# Patient Record
Sex: Female | Born: 1950 | Race: White | Hispanic: No | Smoking: Current every day smoker
Health system: Southern US, Community
[De-identification: ages and names within clinical notes are randomized; demographics above are authoritative.]

## PROBLEM LIST (undated history)

## (undated) DIAGNOSIS — F319 Bipolar disorder, unspecified: Secondary | ICD-10-CM

## (undated) DIAGNOSIS — F909 Attention-deficit hyperactivity disorder, unspecified type: Secondary | ICD-10-CM

## (undated) DIAGNOSIS — R600 Localized edema: Secondary | ICD-10-CM

## (undated) DIAGNOSIS — R32 Unspecified urinary incontinence: Secondary | ICD-10-CM

## (undated) DIAGNOSIS — I10 Essential (primary) hypertension: Secondary | ICD-10-CM

## (undated) DIAGNOSIS — E079 Disorder of thyroid, unspecified: Secondary | ICD-10-CM

## (undated) HISTORY — PX: APPENDECTOMY: SHX54

## (undated) HISTORY — DX: Attention-deficit hyperactivity disorder, unspecified type: F90.9

## (undated) HISTORY — DX: Unspecified urinary incontinence: R32

## (undated) HISTORY — DX: Essential (primary) hypertension: I10

## (undated) HISTORY — DX: Bipolar disorder, unspecified: F31.9

## (undated) HISTORY — PX: OTHER SURGICAL HISTORY: SHX169

---

## 1997-05-27 HISTORY — PX: ABDOMINAL HYSTERECTOMY: SHX81

## 1998-06-24 ENCOUNTER — Other Ambulatory Visit: Admission: RE | Admit: 1998-06-24 | Discharge: 1998-06-24 | Payer: Self-pay | Admitting: Surgery

## 1998-07-07 ENCOUNTER — Ambulatory Visit (HOSPITAL_BASED_OUTPATIENT_CLINIC_OR_DEPARTMENT_OTHER): Admission: RE | Admit: 1998-07-07 | Discharge: 1998-07-07 | Payer: Self-pay | Admitting: Surgery

## 1998-07-27 ENCOUNTER — Ambulatory Visit (HOSPITAL_COMMUNITY): Admission: RE | Admit: 1998-07-27 | Discharge: 1998-07-27 | Payer: Self-pay | Admitting: Surgery

## 1998-07-28 ENCOUNTER — Ambulatory Visit (HOSPITAL_COMMUNITY): Admission: RE | Admit: 1998-07-28 | Discharge: 1998-07-28 | Payer: Self-pay | Admitting: Surgery

## 1998-07-28 ENCOUNTER — Encounter: Payer: Self-pay | Admitting: Surgery

## 1998-12-21 ENCOUNTER — Other Ambulatory Visit: Admission: RE | Admit: 1998-12-21 | Discharge: 1998-12-21 | Payer: Self-pay | Admitting: *Deleted

## 1999-05-17 ENCOUNTER — Encounter: Admission: RE | Admit: 1999-05-17 | Discharge: 1999-05-17 | Payer: Self-pay | Admitting: Gynecology

## 1999-05-17 ENCOUNTER — Encounter: Payer: Self-pay | Admitting: Gynecology

## 1999-06-08 ENCOUNTER — Encounter: Payer: Self-pay | Admitting: Gynecology

## 1999-06-08 ENCOUNTER — Encounter: Admission: RE | Admit: 1999-06-08 | Discharge: 1999-06-08 | Payer: Self-pay | Admitting: Gynecology

## 2000-05-27 HISTORY — PX: THYROID SURGERY: SHX805

## 2000-08-04 ENCOUNTER — Encounter: Payer: Self-pay | Admitting: Surgery

## 2000-08-06 ENCOUNTER — Encounter (INDEPENDENT_AMBULATORY_CARE_PROVIDER_SITE_OTHER): Payer: Self-pay | Admitting: *Deleted

## 2000-08-06 ENCOUNTER — Ambulatory Visit (HOSPITAL_COMMUNITY): Admission: RE | Admit: 2000-08-06 | Discharge: 2000-08-07 | Payer: Self-pay | Admitting: Surgery

## 2000-08-18 ENCOUNTER — Encounter: Payer: Self-pay | Admitting: Gynecology

## 2000-08-18 ENCOUNTER — Encounter: Admission: RE | Admit: 2000-08-18 | Discharge: 2000-08-18 | Payer: Self-pay | Admitting: Gynecology

## 2001-08-24 ENCOUNTER — Other Ambulatory Visit: Admission: RE | Admit: 2001-08-24 | Discharge: 2001-08-24 | Payer: Self-pay | Admitting: Gynecology

## 2001-08-28 ENCOUNTER — Encounter: Payer: Self-pay | Admitting: Gynecology

## 2001-08-28 ENCOUNTER — Encounter: Admission: RE | Admit: 2001-08-28 | Discharge: 2001-08-28 | Payer: Self-pay | Admitting: Gynecology

## 2001-09-04 ENCOUNTER — Encounter: Admission: RE | Admit: 2001-09-04 | Discharge: 2001-09-04 | Payer: Self-pay | Admitting: Gynecology

## 2001-09-04 ENCOUNTER — Encounter: Payer: Self-pay | Admitting: Gynecology

## 2002-02-22 ENCOUNTER — Ambulatory Visit (HOSPITAL_COMMUNITY)
Admission: RE | Admit: 2002-02-22 | Discharge: 2002-02-22 | Payer: Self-pay | Admitting: Physical Medicine and Rehabilitation

## 2002-02-22 ENCOUNTER — Encounter: Payer: Self-pay | Admitting: Physical Medicine and Rehabilitation

## 2002-09-29 ENCOUNTER — Encounter: Admission: RE | Admit: 2002-09-29 | Discharge: 2002-09-29 | Payer: Self-pay

## 2002-11-06 ENCOUNTER — Inpatient Hospital Stay (HOSPITAL_COMMUNITY): Admission: EM | Admit: 2002-11-06 | Discharge: 2002-11-07 | Payer: Self-pay | Admitting: Emergency Medicine

## 2002-11-06 ENCOUNTER — Encounter: Payer: Self-pay | Admitting: Emergency Medicine

## 2002-11-16 ENCOUNTER — Encounter: Admission: RE | Admit: 2002-11-16 | Discharge: 2002-11-16 | Payer: Self-pay | Admitting: Neurosurgery

## 2002-11-16 ENCOUNTER — Encounter: Payer: Self-pay | Admitting: Neurosurgery

## 2002-11-18 ENCOUNTER — Other Ambulatory Visit: Admission: RE | Admit: 2002-11-18 | Discharge: 2002-11-18 | Payer: Self-pay | Admitting: Gynecology

## 2003-05-28 ENCOUNTER — Emergency Department (HOSPITAL_COMMUNITY): Admission: EM | Admit: 2003-05-28 | Discharge: 2003-05-28 | Payer: Self-pay | Admitting: Emergency Medicine

## 2005-05-27 HISTORY — PX: NECK SURGERY: SHX720

## 2005-09-26 ENCOUNTER — Ambulatory Visit (HOSPITAL_COMMUNITY): Admission: RE | Admit: 2005-09-26 | Discharge: 2005-09-27 | Payer: Self-pay | Admitting: Neurological Surgery

## 2008-03-04 ENCOUNTER — Ambulatory Visit (HOSPITAL_COMMUNITY): Admission: RE | Admit: 2008-03-04 | Discharge: 2008-03-04 | Payer: Self-pay | Admitting: Family Medicine

## 2009-08-28 ENCOUNTER — Emergency Department (HOSPITAL_BASED_OUTPATIENT_CLINIC_OR_DEPARTMENT_OTHER): Admission: EM | Admit: 2009-08-28 | Discharge: 2009-08-29 | Payer: Self-pay | Admitting: Emergency Medicine

## 2009-12-19 ENCOUNTER — Emergency Department (HOSPITAL_COMMUNITY): Admission: EM | Admit: 2009-12-19 | Discharge: 2009-12-19 | Payer: Self-pay | Admitting: Emergency Medicine

## 2009-12-27 ENCOUNTER — Ambulatory Visit (HOSPITAL_COMMUNITY): Admission: RE | Admit: 2009-12-27 | Discharge: 2009-12-27 | Payer: Self-pay | Admitting: Family Medicine

## 2010-05-01 ENCOUNTER — Emergency Department (HOSPITAL_COMMUNITY)
Admission: EM | Admit: 2010-05-01 | Discharge: 2010-05-01 | Payer: Self-pay | Source: Home / Self Care | Admitting: Emergency Medicine

## 2010-08-11 LAB — DIFFERENTIAL
Basophils Absolute: 0.1 10*3/uL (ref 0.0–0.1)
Basophils Relative: 1 % (ref 0–1)
Eosinophils Absolute: 0.1 10*3/uL (ref 0.0–0.7)
Eosinophils Relative: 1 % (ref 0–5)
Lymphs Abs: 3.8 10*3/uL (ref 0.7–4.0)
Monocytes Absolute: 0.7 10*3/uL (ref 0.1–1.0)
Neutro Abs: 7.2 10*3/uL (ref 1.7–7.7)

## 2010-08-11 LAB — BASIC METABOLIC PANEL
CO2: 26 mEq/L (ref 19–32)
Chloride: 102 mEq/L (ref 96–112)
Creatinine, Ser: 0.73 mg/dL (ref 0.4–1.2)
Glucose, Bld: 91 mg/dL (ref 70–99)
Potassium: 2.9 mEq/L — ABNORMAL LOW (ref 3.5–5.1)

## 2010-08-11 LAB — CBC
HCT: 39.6 % (ref 36.0–46.0)
Hemoglobin: 13.6 g/dL (ref 12.0–15.0)
MCHC: 34.4 g/dL (ref 30.0–36.0)
MCV: 91 fL (ref 78.0–100.0)

## 2010-08-15 LAB — COMPREHENSIVE METABOLIC PANEL
Alkaline Phosphatase: 65 U/L (ref 39–117)
GFR calc non Af Amer: >60 mL/min (ref >60)
Glucose, Bld: 115 mg/dL — ABNORMAL HIGH (ref 70–99)
Potassium: 3.3 mEq/L — ABNORMAL LOW (ref 3.5–5.1)
Total Bilirubin: 0.6 mg/dL (ref 0.3–1.2)
Total Protein: 7.4 g/dL (ref 6.0–8.3)

## 2010-08-15 LAB — URINALYSIS, ROUTINE W REFLEX MICROSCOPIC
Bilirubin Urine: NEGATIVE
Glucose, UA: NEGATIVE mg/dL

## 2010-08-15 LAB — URINE CULTURE

## 2010-08-15 LAB — URINE MICROSCOPIC-ADD ON

## 2010-08-15 LAB — CBC
Platelets: 386 10*3/uL (ref 150–400)
WBC: 17.5 10*3/uL — ABNORMAL HIGH (ref 4.0–10.5)

## 2010-08-15 LAB — DIFFERENTIAL
Basophils Absolute: 0.2 10*3/uL — ABNORMAL HIGH (ref 0.0–0.1)
Basophils Relative: 1 % (ref 0–1)
Lymphocytes Relative: 13 % (ref 12–46)
Lymphs Abs: 2.3 10*3/uL (ref 0.7–4.0)
Monocytes Relative: 4 % (ref 3–12)
Neutro Abs: 14.3 10*3/uL — ABNORMAL HIGH (ref 1.7–7.7)

## 2010-08-15 LAB — POCT TOXICOLOGY PANEL

## 2010-10-12 NOTE — Op Note (Signed)
[place]  Patient:    Teresa Gomez, Teresa Gomez                   MRN: [id number] Proc. Date: 08/06/00 Adm. Date:  [id number] Attending:  Bonnetta Barry CC:         Fritzi Mandes, M.D.             Gretta Cool, M.D.                           Operative Report  PREOPERATIVE DIAGNOSIS:  Thyroid goiter with dominant left side nodule.  POSTOPERATIVE DIAGNOSIS:  Thyroid goiter with dominant left side nodule.  OPERATION PERFORMED:  Total thyroidectomy.  SURGEON:  Velora Heckler, M.D.  ASSISTANT:  Gita Kudo, M.D.  ANESTHESIA:  General.  ESTIMATED BLOOD LOSS:  Minimal.  PREPARATION:  Betadine.  COMPLICATIONS:  None.  INDICATIONS FOR PROCEDURE:  The patient is a 60 year old white female who presents with multinodular goiter and recurrent thyroid cyst.  She has been followed for over two years.  She has had two previous aspirations of the left-sided cyst.  She now comes to surgery for thyroidectomy for compressive symptoms and recurrent thyroid cyst.  DESCRIPTION OF PROCEDURE:  The procedure was done in OR #15 at the [facility].  The patient was brought to the operating room and placed in supine position on the operating room table.  Following administration of general anesthesia, the patient was positioned and then prepped and draped in the usual strict aseptic fashion.  After ascertaining that an adequate level of anesthesia had been obtained, a standard Kocher incision was made with a #10 blade.  Dissection was carried down through the subcutaneous tissues.  Hemostasis was obtained with the electrocautery. Platysma was divided.  Skin flaps were raised cephalad and caudad from the thyroid notch to the sternal notch.  A Mahorner self-retaining retractor was placed for exposure.  Strap muscles were incised in the midline and dissection was begun on the left side.  Strap muscles were reflected laterally.   Thyroid gland was rolled medially.  There was a multinodular left lobe with a dominant cyst in the posterior left lobe measuring approximately 3 to 4 cm in greatest dimension.  The superior pole vessels were divided between medium Ligaclips and between 3-0 silk ties tied in continuity.  Superior pole was completely immobilized.  Venous tributaries were divided between small Ligaclips.  The inferior pole was mobilized and venous tributaries divided between small and medium Ligaclips.  The recurrent laryngeal nerve was positively identified and preserved.  Branches of the inferior thyroid artery were divided between small Ligaclips.  The gland was rolled medially.  Small pyramidal lobe was taken down using the electrocautery for hemostasis.  Ligament of Allyson Sabal was divided and the gland was rolled up and onto the anterior surface of the trachea. Good hemostasis was noted.  A pack was placed in the left neck.  Next, we turned our attention to the right lobe.  It is markedly smaller, but also multinodular.  Again the lobe was rolled medially.  Middle thyroid vein was divided between small ligaclips.  The superior pole was mobilized and superior pole vessels divided between small and medium Ligaclips as well as ligated in continuity with 3-0 silk ties.  The gland was rolled anteriorly.  Parathyroid tissue was positively identified and preserved.  Venous tributaries were divided between small and medium  Ligaclips.  Branches of the inferior thyroid artery were divided between small Ligaclips.  The gland was rolled anteriorly and the Ligament of Allyson Sabal was divided and the entire thyroid specimen was excised in toto.  The left lobe was sectioned on the table and contained a large cystic structure containing colloid.  A suture was used to mark the superior pole of the gland on the left side.  The gland was submitted to pathology in [place].  Good hemostasis was obtained in the neck.  The neck was  irrigated with warm saline.  Surgicel was placed over the region of the recurrent laryngeal nerves bilaterally.  Strap muscles were reapproximated in the midline with interrupted 3-0 Vicryl sutures.  The platysma was reapproximated with interrupted 3-0 Vicryl sutures.  Skin edges were reapproximated with widely spaced stainless steel staples and interspaced benzoin and half inch Steri-Strips.  Sterile gauze dressings were placed.  The patient was awakened from anesthesia and brought to the recovery room in stable condition.  The patient tolerated the procedure well. DD:  08/06/00 TD:  08/06/00 Job: [id number]

## 2010-10-12 NOTE — Discharge Summary (Signed)
NAME:  Teresa Gomez, Teresa Gomez                   ACCOUNT NO.:  [id number]   MEDICAL RECORD NO.:  [id number]                   PATIENT TYPE:  INP   LOCATION:  5008                                 FACILITY:  [facility]   PHYSICIAN:  Gabrielle Dare. Janee Morn, M.D.             DATE OF BIRTH:  July 23, 1950   DATE OF ADMISSION:  11/06/2002  DATE OF DISCHARGE:  11/07/2002                                 DISCHARGE SUMMARY   CONSULTING PHYSICIAN:  Donalee Citrin, M.D., neurosurgery.   FINAL DIAGNOSES:  1. Fall.  2. Alcohol.  3. C4 body chip fracture with a herniated disk and epidural hematoma.   HISTORY:  This is a 60 year old white female who was drinking after breaking  up with her boyfriend.  She lost her balance and fell to the level ground  striking her face.  She was complaining of neck pain.  On admission, she was  seen by Dr. Janee Morn, who ordered CT of the neck which showed a C4 body chip  fracture.  She also had noted a herniated disease which was notably a  chronic problem not acute.  Because of these findings, Dr. Wynetta Emery was seen.  Dr. Wynetta Emery saw the patient.  She underwent MRI and Dr. Wynetta Emery reviewed it and  noted there was no evidence of any acute injury.  She did have a herniated  disk at C3-4 area.  When he saw the patient, noted that she could be  discharged the following day from his standpoint.  The following morning,  she was doing well.  She was up and about without difficulty.  She was in a  C-collar.  She was complaining of some right shoulder pain but on exam there  was no significant tenderness noted.  Her arm was satisfactory.  She does  have an orthopedic surgeon.  She sees Dr. Luiz Blare, who treats her for other  problems.  At this point, she is prepared for discharge.  She is given  Percocet 1-2 p.o. q.4-6h. p.r.n. for pain.  She is told to follow up with  Dr. Wynetta Emery in two weeks.  She was given Dr. Lonie Peak number and told to call and  make an appointment.  She does not need to follow up with  the trauma service  at this time.  She is given our number to call should she have any problems  or any questions.  Currently, she is discharged home in satisfactory and  stable condition.     Phineas Semen, P.A.                      Gabrielle Dare Janee Morn, M.D.    CL/MEDQ  D:  11/07/2002  T:  11/07/2002  Job:  [id number]   cc:   Donalee Citrin, M.D.  [address]  [place]  [place] [address]  Fax: [phone]   Jimmye Norman III, M.D.  [phone]  [place]  [place] [address]  Fax: [phone]

## 2010-10-12 NOTE — Op Note (Signed)
NAMELAURIE, [name]            ACCOUNT NO.:  [id number]   MEDICAL RECORD NO.:  [id number]          PATIENT TYPE:  AMB   LOCATION:  SDS                          FACILITY:  [facility]   PHYSICIAN:  Stefani Dama, M.D.  DATE OF BIRTH:  01-30-1951   DATE OF PROCEDURE:  09/26/2005  DATE OF DISCHARGE:                                 OPERATIVE REPORT   PREOPERATIVE DIAGNOSIS:  His herniated nucleus pulposus, C6-C7 left, with  left cervical radiculopathy.   POSTOPERATIVE DIAGNOSIS:  His herniated nucleus pulposus, C6-C7 left, with  left cervical radiculopathy.   PROCEDURE:  Anterior cervical decompression arthrodesis C6-C7, with  structural allograft and PEAK bone spacer, anterior fixation with cervical  plate K4-M0, alpha pack.   SURGEON:  Stefani Dama, M.D.   FIRST ASSISTANT:  Payton Doughty, M.D.   ANESTHESIA:  General endotracheal.   INDICATIONS:  Teresa Gomez is a 60 year old individual who has had  significant neck, shoulder, and left arm pain and weakness in the C7  distribution.  She was found to have a herniated nucleus pulposus at C6-7.  Having failed efforts at conservative management and experiencing weakness  in the left triceps region, she was advised regarding surgical decompression  and arthrodesis.   PROCEDURE:  The patient was brought to the operating room and placed on  table supine position. After smooth induction of general endotracheal  anesthesia, the neck was placed in 5 pounds of halter traction and was  prepped with DuraPrep and draped in a sterile fashion.  A transverse  incision was made in the left side of the neck, and the dissection was taken  down through the platysma.  The plane between the sternocleidomastoid and  the strap muscles was dissected bluntly until the prevertebral space was  reached. The first identifiable disk space was noted be that of C6-C7 on a  localizing radiograph. The longus coli muscle was then stripped off of  either  side, and the self-retaining Caspar retractor was placed under the  longus coli muscle, and then dissection was continued by opening the disc  space with a 15 blade using a combination of curettes and rongeurs to remove  a substantial amount of moderately degenerated disc material from within the  dick space at C6-C7.  As the region of the posterior longitudinal ligament  was reached, there was noted be a small osteophytic overgrowth from the  inferior margin of the body of C6. This was dissected bluntly with a small  nerve hook, and as the posterior longitudinal ligament was broken through  there was noted to be some fragments of disc material on the left side in  the lateral recess.  This was taken up first with a 1 and a 2 mm Kerrison  punch, and the disc was exposed more expose more fully, and with dissection  and suction the disc material was removed from the free space.  The ligament  was taken up both medially and laterally across the disk space itself to  allow for exposure of the dura. In the lateral recess, some the hemorrhage  had occurred from epidural veins,  and this was controlled with bipolar  cautery and some small pledgets of Gelfoam soaked in thrombin, which were  later irrigated away. Self-retaining disc spreader was then removed, and the  disc space was shaped to make sure that was level and continuous. Trial  spacer was then used, and it was felt that a 7 mm medium-size PEAK bone  spacer would fit nicely into this defect.  This was then prepared and filled  with demineralized bone matrix and, once filled, it was placed into the  interspace and tamped in place until flush, and the ventral aspect of the  vertebral bodies were fitted with an 18 mm standard-size Alphatec plate with  variable-angle 14 mm screws in C6, fixed-angle 14 mm screws in C7.  With  this, hemostasis was achieved in the anterior longitudinal space, and the  retractor was  removed.  Final localizing  radiograph identified good position of the  surgical construct. The platysma was then closed with 3-0 Vicryl in an  interrupted fashion.  3-0 Vicryl used subcuticularly to close the skin.  The  patient tolerated the procedure well was returned to the recovery room in  stable condition.      Stefani Dama, M.D.  Electronically Signed     HJE/MEDQ  D:  09/26/2005  T:  09/27/2005  Job:  [id number]

## 2011-04-16 ENCOUNTER — Other Ambulatory Visit: Payer: Self-pay | Admitting: Family Medicine

## 2011-04-22 ENCOUNTER — Ambulatory Visit
Admission: RE | Admit: 2011-04-22 | Discharge: 2011-04-22 | Disposition: A | Discharge status: Home / Self Care | Payer: Managed Care, Other (non HMO) | Source: Ambulatory Visit | Attending: Family Medicine | Admitting: Family Medicine

## 2012-04-13 ENCOUNTER — Other Ambulatory Visit: Payer: Self-pay | Admitting: Family Medicine

## 2012-04-13 DIAGNOSIS — Z1231 Encounter for screening mammogram for malignant neoplasm of breast: Secondary | ICD-10-CM

## 2012-05-08 ENCOUNTER — Ambulatory Visit: Payer: Managed Care, Other (non HMO)

## 2013-02-08 ENCOUNTER — Other Ambulatory Visit: Payer: Self-pay | Admitting: Family Medicine

## 2013-02-08 ENCOUNTER — Emergency Department (INDEPENDENT_AMBULATORY_CARE_PROVIDER_SITE_OTHER): Payer: Managed Care, Other (non HMO)

## 2013-02-08 ENCOUNTER — Encounter: Payer: Self-pay | Admitting: *Deleted

## 2013-02-08 ENCOUNTER — Ambulatory Visit: Payer: Managed Care, Other (non HMO)

## 2013-02-08 ENCOUNTER — Emergency Department
Admission: EM | Admit: 2013-02-08 | Discharge: 2013-02-08 | Disposition: A | Discharge status: Home / Self Care | Payer: Managed Care, Other (non HMO) | Source: Home / Self Care | Attending: Family Medicine | Admitting: Family Medicine

## 2013-02-08 ENCOUNTER — Telehealth: Payer: Self-pay | Admitting: *Deleted

## 2013-02-08 DIAGNOSIS — W19XXXA Unspecified fall, initial encounter: Secondary | ICD-10-CM

## 2013-02-08 DIAGNOSIS — M25562 Pain in left knee: Secondary | ICD-10-CM

## 2013-02-08 DIAGNOSIS — M25569 Pain in unspecified knee: Secondary | ICD-10-CM

## 2013-02-08 DIAGNOSIS — Z716 Tobacco abuse counseling: Secondary | ICD-10-CM

## 2013-02-08 HISTORY — DX: Disorder of thyroid, unspecified: E07.9

## 2013-02-08 HISTORY — DX: Localized edema: R60.0

## 2013-02-08 MED ORDER — MELOXICAM 15 MG PO TABS: 15.0000 mg | ORAL_TABLET | Freq: Every day | ORAL | Status: DC

## 2013-02-08 MED ORDER — PREDNISONE 50 MG PO TABS: ORAL_TABLET | ORAL | Status: DC

## 2013-02-08 NOTE — ED Provider Notes (Addendum)
CSN: [id number]     Arrival date & time 02/08/13  1106 History   First MD Initiated Contact with Patient 02/08/13 1110     Chief Complaint  Patient presents with  . Knee Pain     HPI  L knee pain x 2-3 months  Pt states that she was initially struck in R knee by dog 3 months ago. Had initial R knee injury.  Has had progessive L knee pain over past month. Pt states that she accidentally fell on knee 4-6 weeks ago.  Has been able to bare weight.  No knee locking or giving away.  No knee swelling.  Has had worsening pain.  Pain is predominantly medio-posterior in the L knee.  No knee swelling.   Past Medical History  Diagnosis Date  . Thyroid disease   . Edema of both legs    Past Surgical History  Procedure Laterality Date  . Appendectomy    . Neck surgery     Family History  Problem Relation Age of Onset  . Diabetes Mother   . Diabetes Father    History  Substance Use Topics  . Smoking status: Current Every Day Smoker -- 1.00 packs/day  . Smokeless tobacco: Not on file  . Alcohol Use: No   OB History   Grav Para Term Preterm Abortions TAB SAB Ect Mult Living                 Review of Systems  All other systems reviewed and are negative.    Allergies  Review of patient's allergies indicates no known allergies.  Home Medications   Current Outpatient Rx  Name  Route  Sig  Dispense  Refill  . hydrochlorothiazide (HYDRODIURIL) 25 MG tablet   Oral   Take 25 mg by mouth daily.         Marland Kitchen levothyroxine (SYNTHROID, LEVOTHROID) 200 MCG tablet   Oral   Take 200 mcg by mouth daily before breakfast.          BP 128/79  Pulse 62  Temp(Src) 98.2 F (36.8 C) (Oral)  Resp 18  SpO2 97% Physical Exam  Constitutional: She appears well-developed and well-nourished.  HENT:  Head: Normocephalic and atraumatic.  Eyes: Conjunctivae are normal. Pupils are equal, round, and reactive to light.  Neck: Normal range of motion. Neck supple.  Cardiovascular: Normal  rate and regular rhythm.   Pulmonary/Chest: Effort normal and breath sounds normal.  Abdominal: Soft.  Musculoskeletal:       Legs: Neurological: She is alert.  Skin: Skin is warm.    ED Course  Procedures (including critical care time) Labs Review Labs Reviewed - No data to display Imaging Review Dg Knee Complete 4 Views Left  02/08/2013   *RADIOLOGY REPORT*  Clinical Data: Pain post fall.  LEFT KNEE - COMPLETE 4+ VIEW  Comparison: None.  Findings: No effusion. Negative for fracture, dislocation, or other acute abnormality.  Normal alignment and mineralization. No significant degenerative change.  Regional soft tissues unremarkable.  IMPRESSION:  Negative   Original Report Authenticated By: D. Andria Rhein, MD    MDM   1. Knee pain, left    Exam most consistent with medial meniscal injury versus MCL strain. Will place brace. NSAIDs and ice.Given the patient does have some posterior calf pain, will obtain lotion or sound rule out Baker's cyst versus DVT. Well score of around 0-1. Patient is a smoker. Discussed smoking cessation. Discussed general care musculoskeletal red flags. Followup with sports medicine  symptoms fail to improve.     The patient and/or caregiver has been counseled thoroughly with regard to treatment plan and/or medications prescribed including dosage, schedule, interactions, rationale for use, and possible side effects and they verbalize understanding. Diagnoses and expected course of recovery discussed and will return if not improved as expected or if the condition worsens. Patient and/or caregiver verbalized understanding.         Doree Albee, MD 02/08/13 1225  US Venous Img Lower Unilateral Left  02/08/2013   *RADIOLOGY REPORT*  Clinical Data: Left knee swelling and pain, calf pain  LEFT LOWER EXTREMITY VENOUS DOPPLER ULTRASOUND  Technique: Gray-scale sonography with compression, as well as color and duplex ultrasound, were performed to evaluate the  deep venous system from the level of the common femoral vein through the popliteal and proximal calf veins.  Comparison: None  Findings:  Normal compressibility of  the common femoral, superficial femoral, and popliteal veins, as well as the proximal calf veins.  No filling defects to suggest DVT on grayscale or color Doppler imaging.  Doppler waveforms show normal direction of venous flow, normal respiratory phasicity and response to augmentation. There is a 6 x 10 x 19 mm fluid collection in the posteromedial left popliteal fossa.  IMPRESSION: 1.  No evidence of  lower extremity deep vein thrombosis. 2.  Small left Baker's cyst.   Original Report Authenticated By: D. Andria Rhein, MD   Dg Knee Complete 4 Views Left  02/08/2013   *RADIOLOGY REPORT*  Clinical Data: Pain post fall.  LEFT KNEE - COMPLETE 4+ VIEW  Comparison: None.  Findings: No effusion. Negative for fracture, dislocation, or other acute abnormality.  Normal alignment and mineralization. No significant degenerative change.  Regional soft tissues unremarkable.  IMPRESSION:  Negative   Original Report Authenticated By: D. Andria Rhein, MD   Clinical update: Janna Arch a meal she sound noted with no DVT and small Baker's cyst. Prescribed patient short course of prednisone. Hold off meloxicam. Followup with sports medicine.  Doree Albee, MD 02/08/13 1524

## 2013-02-08 NOTE — ED Notes (Signed)
Pt c/o LT knee pain. She reports injuring her RT knee post fall on 11/09/12, with some pain in her LT knee. She reports re-injuring it 1 mth ago post fall at home. No OTC meds.

## 2013-02-10 ENCOUNTER — Telehealth: Payer: Self-pay | Admitting: *Deleted

## 2013-03-05 ENCOUNTER — Institutional Professional Consult (permissible substitution): Payer: Managed Care, Other (non HMO) | Admitting: Sports Medicine

## 2013-03-08 ENCOUNTER — Ambulatory Visit (INDEPENDENT_AMBULATORY_CARE_PROVIDER_SITE_OTHER): Payer: Managed Care, Other (non HMO) | Admitting: Sports Medicine

## 2013-03-08 ENCOUNTER — Encounter: Payer: Self-pay | Admitting: Sports Medicine

## 2013-03-08 ENCOUNTER — Ambulatory Visit (INDEPENDENT_AMBULATORY_CARE_PROVIDER_SITE_OTHER): Payer: Managed Care, Other (non HMO)

## 2013-03-08 VITALS — BP 132/80 | HR 68 | Wt 170.0 lb

## 2013-03-08 DIAGNOSIS — M76899 Other specified enthesopathies of unspecified lower limb, excluding foot: Secondary | ICD-10-CM

## 2013-03-08 DIAGNOSIS — M7061 Trochanteric bursitis, right hip: Secondary | ICD-10-CM | POA: Insufficient documentation

## 2013-03-08 DIAGNOSIS — M25562 Pain in left knee: Secondary | ICD-10-CM | POA: Insufficient documentation

## 2013-03-08 DIAGNOSIS — M25569 Pain in unspecified knee: Secondary | ICD-10-CM

## 2013-03-08 MED ORDER — MELOXICAM 15 MG PO TABS: ORAL_TABLET | ORAL | Status: AC

## 2013-03-08 NOTE — Assessment & Plan Note (Signed)
Repeat x-rays with a different view. Mobic, formal physical therapy. Return in one month, consider injection if not better.

## 2013-03-08 NOTE — Progress Notes (Signed)
   Subjective:    I'm seeing this patient as a consultation for:  Dr. Alvester Morin  CC: Left knee pain  HPI: This is a very pleasant 62 year old female, approximately 4 months ago she fell, injuring her right knee, and left knee. Unfortunately she has continued to have pain she localizes beneath the patella as well as the medial joint line of her left knee with occasional grinding but not any locking or popping. There is mild swelling. Symptoms are moderate, persistent. It hurts when getting up out of a chair, and going up and down stairs. She's really not taking any medication for this. Has not done any therapy. She was referred to me for further evaluation and definitive treatment.  She also endorses chronic right trochanteric bursitis status post multiple injections, she has never had any physical therapy or rehabilitation, her surgeon is now recommending surgical bursectomy.  Past medical history, Surgical history, Family history not pertinant except as noted below, Social history, Allergies, and medications have been entered into the medical record, reviewed, and no changes needed.   Review of Systems: No headache, visual changes, nausea, vomiting, diarrhea, constipation, dizziness, abdominal pain, skin rash, fevers, chills, night sweats, weight loss, swollen lymph nodes, body aches, joint swelling, muscle aches, chest pain, shortness of breath, mood changes, visual or auditory hallucinations.   Objective:   General: Well Developed, well nourished, and in no acute distress.  Neuro/Psych: Alert and oriented x3, extra-ocular muscles intact, able to move all 4 extremities, sensation grossly intact. Skin: Warm and dry, no rashes noted.  Respiratory: Not using accessory muscles, speaking in full sentences, trachea midline.  Cardiovascular: Pulses palpable, no extremity edema. Abdomen: Does not appear distended. Left Knee: Normal to inspection with no erythema or effusion or obvious bony  abnormalities. Tender to palpation at the medial joint line as well as around the lateral patellar facet. ROM full in flexion and extension and lower leg rotation. Ligaments with solid consistent endpoints including ACL, PCL, LCL, MCL. Negative Mcmurray's, Apley's, and Thessalonian tests. Non painful patellar compression. Patellar glide without crepitus. Patellar and quadriceps tendons unremarkable. Hamstring and quadriceps strength is normal.  Right Hip: ROM Weak hip abduction on the right side compared to the left. Pelvic alignment unremarkable to inspection and palpation. Standing hip rotation and gait without trendelenburg sign / unsteadiness.  mild tenderness to palpation of the greater trochanter. No pain with FABER or FADIR. No SI joint tenderness and normal minimal SI movement.  X-rays were reviewed, there were significant osteophytes around the patella visible mostly a merchant view, there is also a lateral tilt of the patella. Tibiofemoral joint spaces are relatively well preserved.  Impression and Recommendations:   This case required medical decision making of moderate complexity.

## 2013-03-08 NOTE — Assessment & Plan Note (Signed)
Has had multiple injections, continues to have hip abductor weakness. This has never been addressed. I would like her to work with formal physical therapy on aggressive hip abductor strengthening, afterwards we can certainly consider injection and needle tenotomy if no better. Mobic.

## 2013-03-08 NOTE — Addendum Note (Signed)
Addended by: Monica Becton on: 03/08/2013 01:15 PM   Modules accepted: Level of Service

## 2013-04-05 ENCOUNTER — Ambulatory Visit: Payer: Managed Care, Other (non HMO) | Admitting: Sports Medicine

## 2013-04-05 DIAGNOSIS — Z0289 Encounter for other administrative examinations: Secondary | ICD-10-CM

## 2013-08-12 ENCOUNTER — Other Ambulatory Visit: Payer: Self-pay | Admitting: Family Medicine

## 2013-08-12 DIAGNOSIS — F17200 Nicotine dependence, unspecified, uncomplicated: Secondary | ICD-10-CM

## 2013-09-07 ENCOUNTER — Other Ambulatory Visit: Payer: Managed Care, Other (non HMO)

## 2014-05-13 IMAGING — CR DG KNEE 1-2V*R*
4 series · 4 of 4 positions shown · non-contrast
Comparison: None.

CLINICAL DATA: Left knee pain.

EXAM:
RIGHT KNEE - 1-2 VIEW

[view not recorded (1 of 4)]
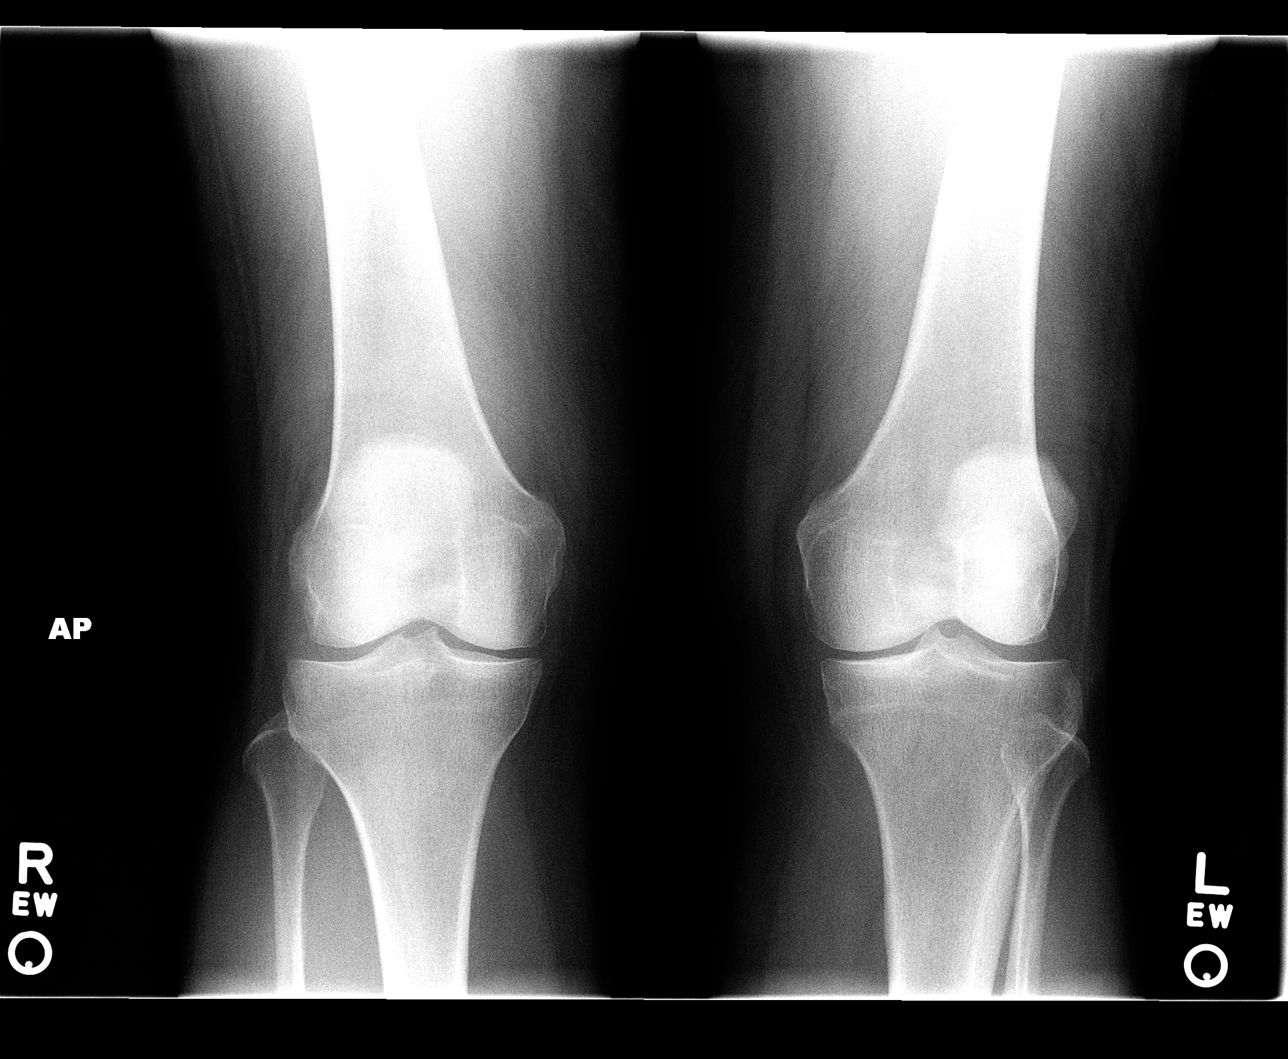

[view not recorded (2 of 4)]
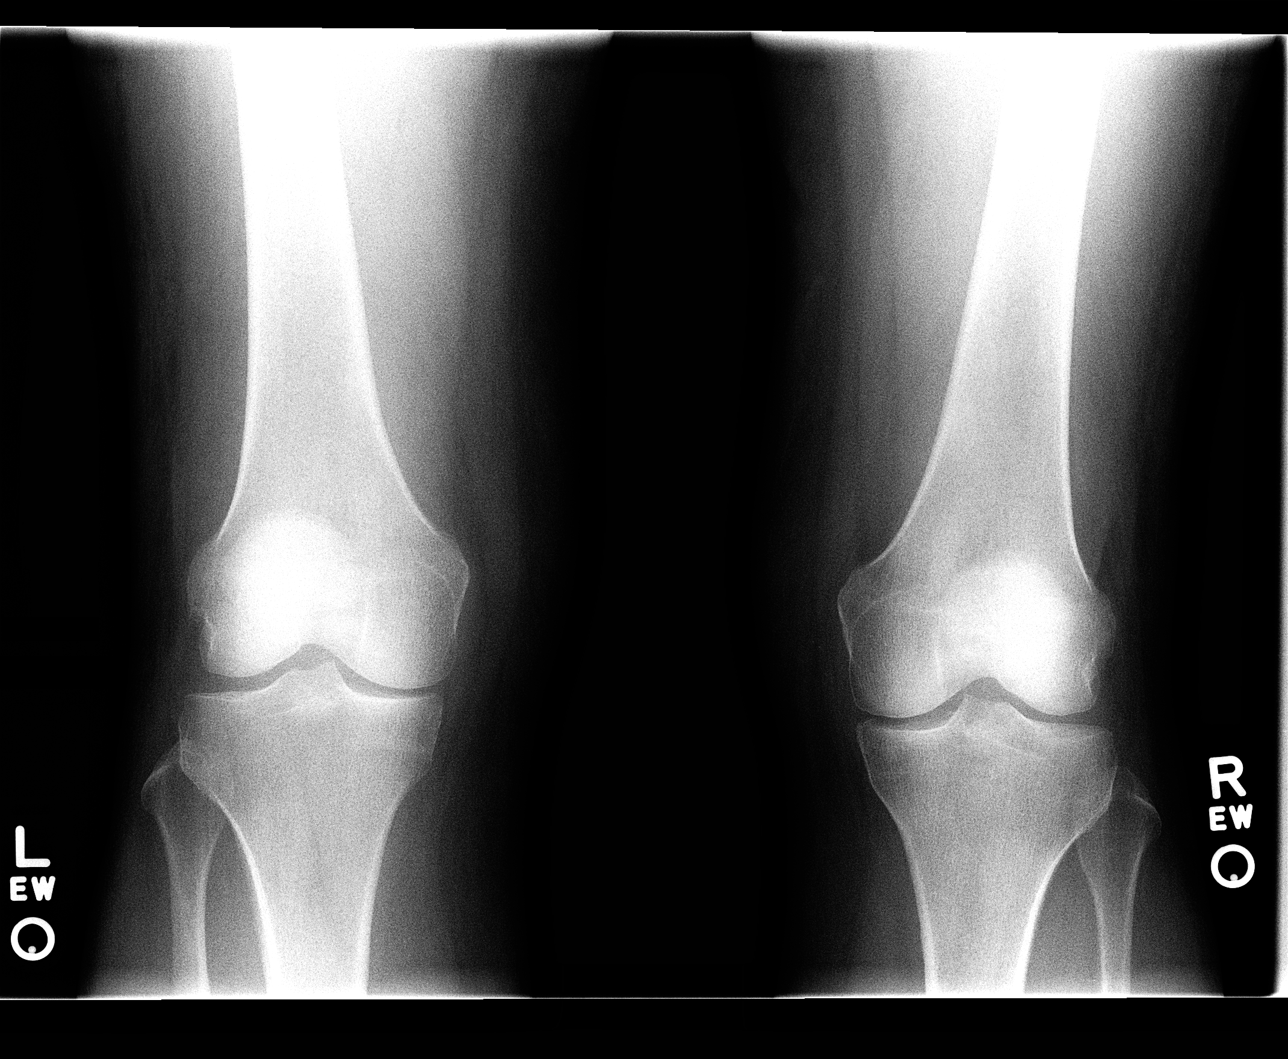

[view not recorded (3 of 4)]
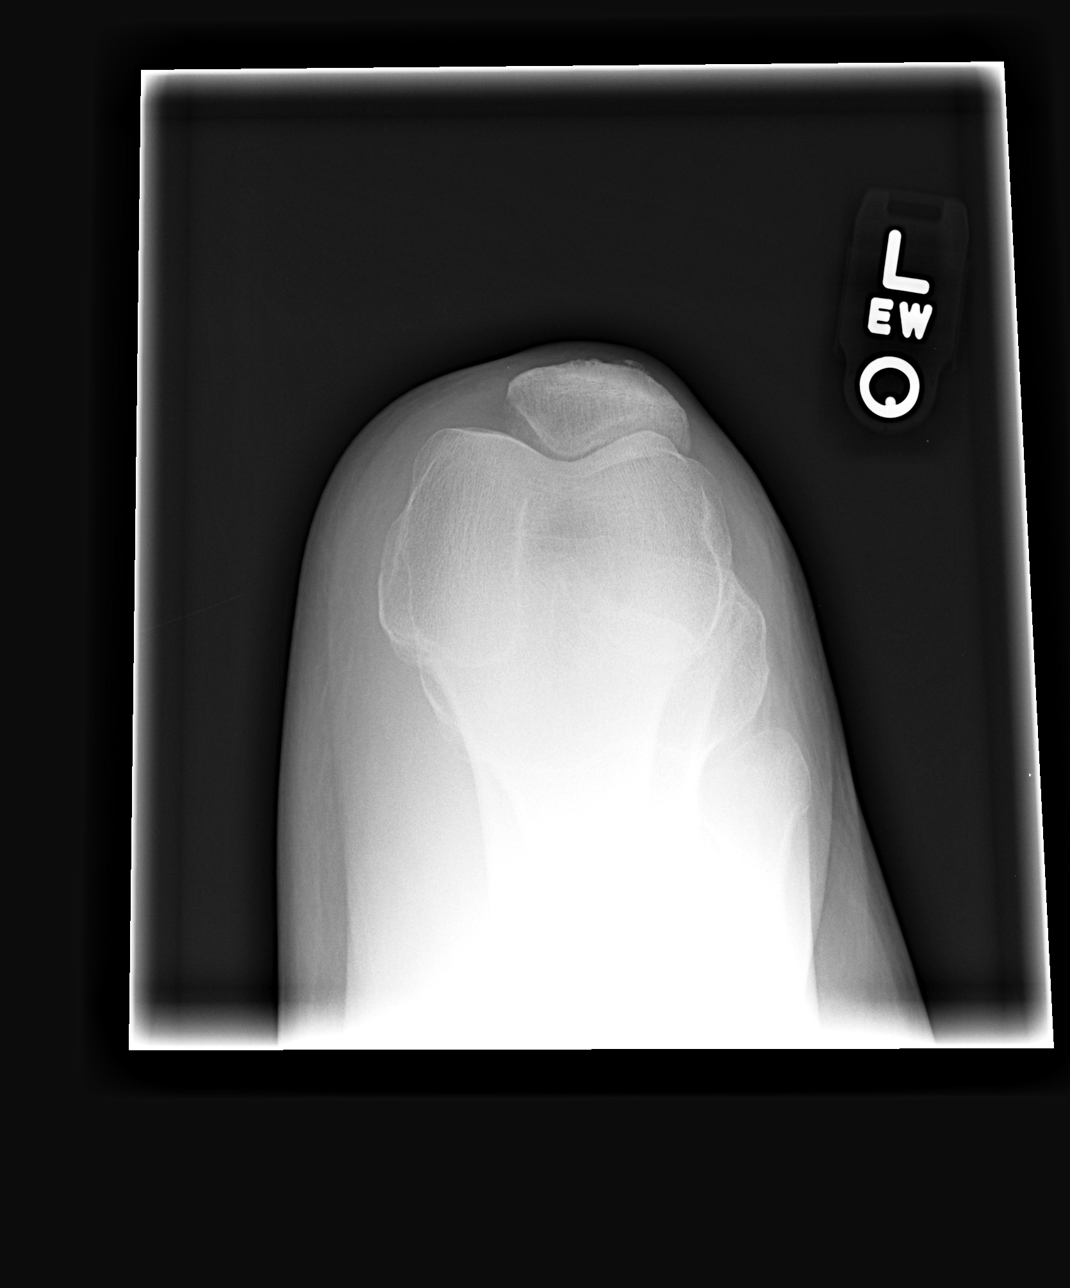

[view not recorded (4 of 4)]
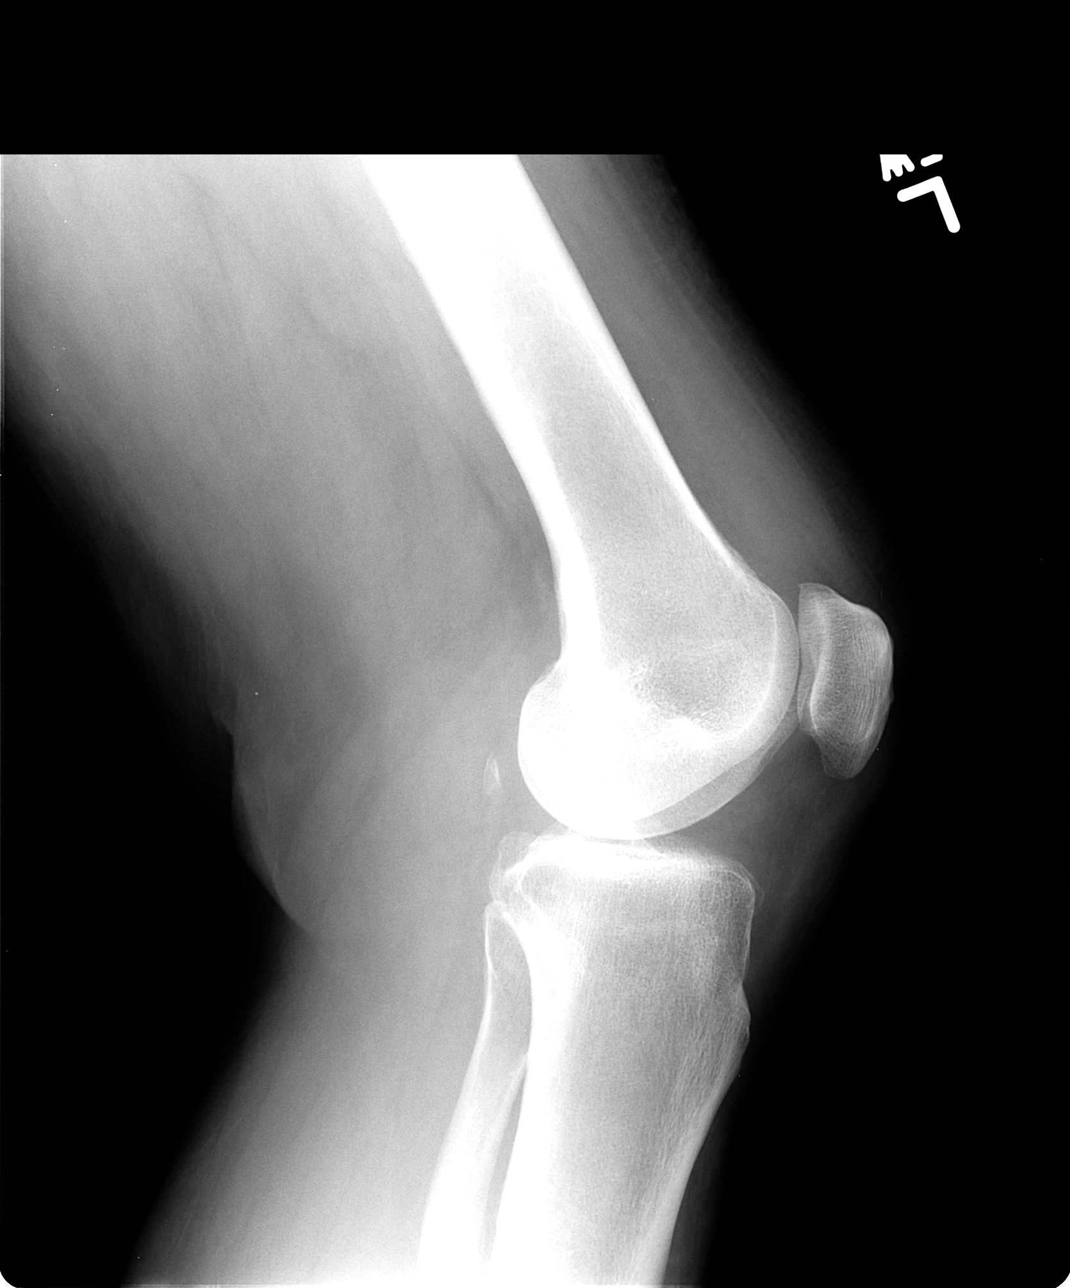

[4 of 4 positions shown; findings below may reference images not displayed]

FINDINGS: There is no evidence of fracture, dislocation, or joint effusion.
There is no evidence of arthropathy or other focal bone abnormality.
Soft tissues are unremarkable.
IMPRESSION: Negative.

## 2014-08-10 ENCOUNTER — Other Ambulatory Visit: Payer: Self-pay | Admitting: Family Medicine

## 2014-08-10 DIAGNOSIS — F17200 Nicotine dependence, unspecified, uncomplicated: Secondary | ICD-10-CM

## 2014-09-12 ENCOUNTER — Inpatient Hospital Stay: Admission: RE | Admit: 2014-09-12 | Payer: Managed Care, Other (non HMO) | Source: Ambulatory Visit

## 2015-06-05 ENCOUNTER — Telehealth: Payer: Self-pay | Admitting: Obstetrics and Gynecology

## 2015-06-05 NOTE — Telephone Encounter (Signed)
Called and left a message for patient to call back to schedule a new patient doctor referral. °

## 2015-06-06 NOTE — Telephone Encounter (Signed)
Scheduled 06/19/15 with Dr. Edward JollySilva.

## 2015-06-19 ENCOUNTER — Encounter: Payer: Self-pay | Admitting: Obstetrics and Gynecology

## 2015-06-19 ENCOUNTER — Ambulatory Visit (INDEPENDENT_AMBULATORY_CARE_PROVIDER_SITE_OTHER): Payer: BLUE CROSS/BLUE SHIELD | Admitting: Obstetrics and Gynecology

## 2015-06-19 VITALS — BP 128/80 | HR 80 | Resp 22 | Ht 68.0 in | Wt 220.2 lb

## 2015-06-19 DIAGNOSIS — N3946 Mixed incontinence: Secondary | ICD-10-CM

## 2015-06-19 DIAGNOSIS — R159 Full incontinence of feces: Secondary | ICD-10-CM

## 2015-06-19 NOTE — Progress Notes (Signed)
Patient ID: Teresa Gomez, female   DOB: 02-02-1951, 65 y.o.   MRN: [id number] 65 y.o. G24P2002 Widowed Caucasian female here for urinary and fecal incontinence.   History of hysterectomy and anterior and posterior colporrhaphy and sling? with Dr. Phyllis Ginger.  Leaks with sneeze, cough, and laugh all the time.  Not losing total control of bladder.  No spontaneous leakage of urine.  Has some urgency incontinence. DF - every 5 hours. NF - once.  Has enuresis, but not recently.  Was a bed wetter as a child.  Had urethral dilation. Last episode over one year ago.  Hx of UTIs, but this was 20 years ago.  Hx of pyelonephritis 20 yearsago.  No hx of stones.  Denies hematuria or dysuria.   Drinks caffeine - coffee 4 - 5 per day.  No sodas or black tea.  Also having anal leakage of liquid and solid stool.  Good control of gas. Had an anal cyst removed.  Had incontinence following this and now having fecal incontinence. Had a sphincter repair done in [facility].  Had some control of bowel movements after this.  Works for [employer], now Oncologist.  Works in [employer].   PCP: Gweneth Dimitri, MD    No LMP recorded. Patient has had a hysterectomy.          Sexually active: No.Widowed  The current method of family planning is status post hysterectomy.    Exercising: No.   Smoker:  Yes, Smokes 1/2 ppd  Health Maintenance: Pap:  2000 normal per patient History of abnormal Pap:  Yes, at age 89 had colposcopy, cryotherapy and conization of cervix. Paps were normal after these procedures. MMG:  2016 normal through mobile unit at work Colonoscopy:  2015 polyps with Dr. Gerda Diss due 2019. BMD:  2009  Result  Normal per patient  TDaP:  2009 Screening Labs:  Hb today: PCP, Urine today: unable to void.   reports that she has been smoking Cigarettes.  She has been smoking about 0.50 packs per day. She does not have any smokeless tobacco history on file. She reports that she does not drink  alcohol or use illicit drugs.  Past Medical History  Diagnosis Date  . Edema of both legs   . Urinary incontinence   . Thyroid disease     multi nodular goiters--benigns  . Bipolar disorder (HCC)   . ADHD (attention deficit hyperactivity disorder)   . Hypertension     Past Surgical History  Procedure Laterality Date  . Appendectomy    . Neck surgery  2007    C5,C6 fusion--Dr. Danielle Dess  . Thyroid surgery  2002  . Pilonidal cyst removal  early 30's    [place]  . Sphincter repair  1997?    [facility]  . Abdominal hysterectomy  1999    A & P repair ?Sling--ovaries remain(Dr. Zollie Beckers Ray)--uterine prolapse    Current Outpatient Prescriptions  Medication Sig Dispense Refill  . buPROPion (WELLBUTRIN XL) 300 MG 24 hr tablet Take 300 mg by mouth daily.    Marland Kitchen etodolac (LODINE) 400 MG tablet Take 400 mg by mouth 2 (two) times daily.    . hydrochlorothiazide (HYDRODIURIL) 25 MG tablet Take 25 mg by mouth daily.    Marland Kitchen levothyroxine (SYNTHROID, LEVOTHROID) 200 MCG tablet Take 200 mcg by mouth daily before breakfast.    . meloxicam (MOBIC) 15 MG tablet One tab PO qAM with breakfast for 2 weeks, then daily prn pain. 30 tablet 3  .  methylphenidate (RITALIN) 20 MG tablet Take 20 mg by mouth 2 (two) times daily.    Marland Kitchen zolpidem (AMBIEN) 10 MG tablet Take 10 mg by mouth at bedtime as needed for sleep.     No current facility-administered medications for this visit.    Family History  Problem Relation Age of Onset  . Diabetes Mother   . Hypertension Mother   . Thyroid disease Mother     goiters  . Diabetes Father   . Stroke Father   . Angina Maternal Grandmother     ROS:  Pertinent items are noted in HPI.  Otherwise, a comprehensive ROS was negative.  Exam:   BP 128/80 mmHg  Pulse 80  Resp 22  Ht  (1.727 m)  Wt 220 lb 3.2 oz (99.882 kg)  BMI 33.49 kg/m2    General appearance: alert, cooperative and appears stated age Head: Normocephalic, without obvious abnormality,  atraumatic Neck: no adenopathy, supple, symmetrical, trachea midline and thyroid normal to inspection and palpation Lungs: clear to auscultation bilaterally Heart: regular rate and rhythm Abdomen: Pfannenstiel incision, two right transverse incisions (one was appy and one was tatoo removal), soft, non-tender; bowel sounds normal; no masses,  no organomegaly Extremities: extremities normal, atraumatic, no cyanosis or edema Skin: Skin color, texture, turgor normal. No rashes or lesions Lymph nodes: Cervical, supraclavicular, and axillary nodes normal. No abnormal inguinal nodes palpated Neurologic: Grossly normal  Pelvic: External genitalia:  no lesions              Urethra:  normal appearing urethra with no masses, tenderness or lesions              Bartholins and Skenes: normal                 Vagina: normal appearing vagina with normal color and discharge, no lesions.  Minimal cystocele.                 Cervix: absent              Bimanual Exam:  Uterus:  uterus absent              Adnexa: no mass, fullness, tenderness              Rectovaginal: Yes.  .  Confirms.              Anus:  sphincter tone decreased , no lesions  Chaperone was present for exam.  Assessment:    Status post hysterectomy with anterior and posterior colporrhaphy and sling?  Mixed incontinence.  Fecal incontinence.   Plan:   I have had a comprehensive discussion with the patient regarding urinary incontinence.  I have provided reading materials from [employer] regarding incontinence in general as well as medical and surgical treatment for these conditions. Medical treatments may include physical therapy, pessary use, Impressa, anticholinergic/antimuscarinic therapy, and dietary modification to reduce bladder irritants.   We discussed possible surgical care of a midurethral sling/cystoscopy and prolapse repair if needed.  Will proceed with urodynamic testing.   Get OP reports from hysterectomy and prolapse  repair/bladder surgery.   Referral to Dr. Romie Levee for fecal incontinence.  After visit summary provided.   ___30____ minutes face to face time of which over 50% was spent in counseling.

## 2015-06-30 ENCOUNTER — Telehealth: Payer: Self-pay | Admitting: Obstetrics and Gynecology

## 2015-06-30 NOTE — Telephone Encounter (Signed)
Left message on voicemail to call regarding records release.

## 2015-08-04 ENCOUNTER — Telehealth: Payer: Self-pay | Admitting: Obstetrics and Gynecology

## 2015-08-04 NOTE — Telephone Encounter (Signed)
Left message to call regarding records release. [facility] by Dr Phyllis GingerWalter Ray.
